# Patient Record
Sex: Female | Born: 2005 | Race: White | Hispanic: No | Marital: Single | Smoking: Never smoker
Health system: Southern US, Community
[De-identification: ages and names within clinical notes are randomized; demographics above are authoritative.]

## PROBLEM LIST (undated history)

## (undated) DIAGNOSIS — K5900 Constipation, unspecified: Secondary | ICD-10-CM

## (undated) DIAGNOSIS — R109 Unspecified abdominal pain: Secondary | ICD-10-CM

## (undated) HISTORY — DX: Constipation, unspecified: K59.00

## (undated) HISTORY — DX: Unspecified abdominal pain: R10.9

## (undated) HISTORY — PX: TONSILLECTOMY: SUR1361

---
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
## 2024-01-18 ENCOUNTER — Telehealth (INDEPENDENT_AMBULATORY_CARE_PROVIDER_SITE_OTHER): Admitting: Adult Health

## 2024-01-18 ENCOUNTER — Encounter: Payer: Self-pay | Admitting: Adult Health

## 2024-01-18 VITALS — Ht 64.0 in | Wt 130.0 lb

## 2024-01-18 DIAGNOSIS — F1910 Other psychoactive substance abuse, uncomplicated: Secondary | ICD-10-CM

## 2024-01-18 DIAGNOSIS — G4700 Insomnia, unspecified: Secondary | ICD-10-CM | POA: Diagnosis not present

## 2024-01-18 DIAGNOSIS — F411 Generalized anxiety disorder: Secondary | ICD-10-CM

## 2024-01-18 DIAGNOSIS — F0630 Mood disorder due to known physiological condition, unspecified: Secondary | ICD-10-CM

## 2024-01-18 DIAGNOSIS — F4310 Post-traumatic stress disorder, unspecified: Secondary | ICD-10-CM

## 2024-01-18 MED ORDER — LITHIUM CARBONATE 300 MG PO CAPS
300.0000 mg | ORAL_CAPSULE | Freq: Every day | ORAL | 0 refills | Status: DC
Start: 2024-01-18 — End: 2024-01-31

## 2024-01-18 MED ORDER — CLONIDINE HCL 0.1 MG PO TABS
0.1000 mg | ORAL_TABLET | Freq: Every day | ORAL | 0 refills | Status: DC
Start: 2024-01-18 — End: 2024-03-27

## 2024-01-18 NOTE — Progress Notes (Signed)
 Error

## 2024-01-18 NOTE — Addendum Note (Signed)
 Addended by: LAWERANCE ANGELINE SAILOR on: 01/18/2024 02:23 PM   Modules accepted: Level of Service
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
## 2024-01-18 NOTE — Addendum Note (Signed)
 Addended by: LAWERANCE ANGELINE SAILOR on: 01/18/2024 01:26 PM   Modules accepted: Orders
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
## 2024-01-23 ENCOUNTER — Telehealth: Payer: Self-pay | Admitting: Adult Health

## 2024-01-23 DIAGNOSIS — F418 Other specified anxiety disorders: Secondary | ICD-10-CM

## 2024-01-23 MED ORDER — HYDROXYZINE HCL 25 MG PO TABS: ORAL_TABLET | ORAL | 0 refills | Status: DC

## 2024-01-23 NOTE — Telephone Encounter (Signed)
 Patient advised to hold lithium  and touch base on Thursday. Rx for hydroxyzine  sent.

## 2024-01-23 NOTE — Telephone Encounter (Signed)
 Pt seen for initial visit on 6/26.    Add Lithium  300mg  at hs for - mood disorder, depression Add Clonidine  0.1mg  at hs - PTSD, sleep Add Hydroxyzine  50mg  daily as needed  Under medication changes it shows hydroxyzine  was discontinued and no Rx was sent.  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 Works [employer] and just can't function. Reports she thinks it is the lithium  causing sx.  She is taking at night. No other complaints.

## 2024-01-23 NOTE — Telephone Encounter (Signed)
 Pt called and said that the new medication is making her unable to function. She wants to change the medicine. Please call her at [phone]

## 2024-01-25 ENCOUNTER — Telehealth: Payer: Self-pay | Admitting: Adult Health

## 2024-01-25 NOTE — Telephone Encounter (Signed)
 Patient called in with update on medication. States that she stopped taking the Lithium  and doesn't feel sedated. Thinks that it what was causing her to feel that way. Ph: [phone] Appt 7/9

## 2024-01-25 NOTE — Telephone Encounter (Signed)
 Pt had called earlier this week, was c/o being sedated. You recommended stopping the lithium  and report back in today. She reports no sedation since stopping the lithium .

## 2024-01-29 NOTE — Telephone Encounter (Signed)
 Pt reports taking the clonidine . She has an appt on 7/8 and can discuss options at that time.

## 2024-01-31 ENCOUNTER — Telehealth (INDEPENDENT_AMBULATORY_CARE_PROVIDER_SITE_OTHER): Admitting: Adult Health

## 2024-01-31 ENCOUNTER — Encounter: Payer: Self-pay | Admitting: Adult Health

## 2024-01-31 DIAGNOSIS — G4700 Insomnia, unspecified: Secondary | ICD-10-CM | POA: Diagnosis not present

## 2024-01-31 DIAGNOSIS — F4310 Post-traumatic stress disorder, unspecified: Secondary | ICD-10-CM

## 2024-01-31 DIAGNOSIS — F1910 Other psychoactive substance abuse, uncomplicated: Secondary | ICD-10-CM

## 2024-01-31 DIAGNOSIS — F411 Generalized anxiety disorder: Secondary | ICD-10-CM | POA: Diagnosis not present

## 2024-01-31 DIAGNOSIS — F0630 Mood disorder due to known physiological condition, unspecified: Secondary | ICD-10-CM | POA: Diagnosis not present

## 2024-01-31 MED ORDER — TRAZODONE HCL 50 MG PO TABS
50.0000 mg | ORAL_TABLET | Freq: Every day | ORAL | 2 refills | Status: DC
Start: 2024-01-31 — End: 2024-03-27

## 2024-01-31 MED ORDER — LAMOTRIGINE 25 MG PO TABS: ORAL_TABLET | ORAL | 2 refills | Status: DC
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
## 2024-02-12 ENCOUNTER — Other Ambulatory Visit: Payer: Self-pay | Admitting: Adult Health

## 2024-02-12 DIAGNOSIS — F1910 Other psychoactive substance abuse, uncomplicated: Secondary | ICD-10-CM

## 2024-02-12 DIAGNOSIS — F4310 Post-traumatic stress disorder, unspecified: Secondary | ICD-10-CM

## 2024-02-12 DIAGNOSIS — F411 Generalized anxiety disorder: Secondary | ICD-10-CM

## 2024-02-12 DIAGNOSIS — G4700 Insomnia, unspecified: Secondary | ICD-10-CM

## 2024-02-20 ENCOUNTER — Other Ambulatory Visit: Payer: Self-pay | Admitting: Adult Health

## 2024-02-20 DIAGNOSIS — F418 Other specified anxiety disorders: Secondary | ICD-10-CM

## 2024-02-21 ENCOUNTER — Other Ambulatory Visit: Payer: Self-pay | Admitting: Adult Health

## 2024-02-21 DIAGNOSIS — F418 Other specified anxiety disorders: Secondary | ICD-10-CM

## 2024-02-22 ENCOUNTER — Other Ambulatory Visit: Payer: Self-pay | Admitting: Adult Health

## 2024-02-22 DIAGNOSIS — G4700 Insomnia, unspecified: Secondary | ICD-10-CM

## 2024-02-22 DIAGNOSIS — F0630 Mood disorder due to known physiological condition, unspecified: Secondary | ICD-10-CM

## 2024-02-22 MED ORDER — HYDROXYZINE HCL 50 MG PO TABS: ORAL_TABLET | ORAL | 0 refills | Status: DC

## 2024-03-13 ENCOUNTER — Ambulatory Visit: Admitting: Professional Counselor

## 2024-03-14 ENCOUNTER — Telehealth: Payer: Self-pay

## 2024-03-14 ENCOUNTER — Encounter: Payer: Self-pay | Admitting: Professional Counselor

## 2024-03-14 ENCOUNTER — Ambulatory Visit: Admitting: Professional Counselor

## 2024-03-14 DIAGNOSIS — Z634 Disappearance and death of family member: Secondary | ICD-10-CM

## 2024-03-14 DIAGNOSIS — F39 Unspecified mood [affective] disorder: Secondary | ICD-10-CM

## 2024-03-14 DIAGNOSIS — F411 Generalized anxiety disorder: Secondary | ICD-10-CM

## 2024-03-14 NOTE — Telephone Encounter (Signed)
 No earlier appts unless Tillman wants to give up her Admin time today and I can put her at 530.

## 2024-03-14 NOTE — Telephone Encounter (Signed)
 Thank you, but no to today.

## 2024-03-14 NOTE — Telephone Encounter (Signed)
 Pt in the office to see Deane today. Her father recently died and she reports not sleeping, though reports sleep issues pre-date his death.   D/C Clonidine  0.1mg  at hs - PTSD, sleep D/C Lithium  300mg  at hs for - mood disorder, depression - did not tolerate   Add Lamictal  25mg  at hs x 2 weeks, then increase to 50mg  at hs  Add Trazadone 50mg  at hs for sleep   Continue Hydroxyzine  50mg  daily as needed.  Reports taking medications as prescribed.    Note said she had [facility] elsewhere, but I don't see in her chart.
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
## 2024-03-14 NOTE — Telephone Encounter (Signed)
 Has FU 9/3.

## 2024-03-14 NOTE — Telephone Encounter (Signed)
 Please see if there is an earlier appt available and call patient to schedule.

## 2024-03-20 ENCOUNTER — Other Ambulatory Visit: Payer: Self-pay | Admitting: Adult Health

## 2024-03-20 DIAGNOSIS — G4700 Insomnia, unspecified: Secondary | ICD-10-CM

## 2024-03-27 ENCOUNTER — Encounter: Payer: Self-pay | Admitting: Adult Health

## 2024-03-27 ENCOUNTER — Telehealth: Admitting: Adult Health

## 2024-03-27 DIAGNOSIS — F0630 Mood disorder due to known physiological condition, unspecified: Secondary | ICD-10-CM

## 2024-03-27 DIAGNOSIS — F411 Generalized anxiety disorder: Secondary | ICD-10-CM

## 2024-03-27 DIAGNOSIS — F1910 Other psychoactive substance abuse, uncomplicated: Secondary | ICD-10-CM

## 2024-03-27 DIAGNOSIS — F4310 Post-traumatic stress disorder, unspecified: Secondary | ICD-10-CM | POA: Diagnosis not present

## 2024-03-27 DIAGNOSIS — G4700 Insomnia, unspecified: Secondary | ICD-10-CM | POA: Diagnosis not present

## 2024-03-27 MED ORDER — TRAZODONE HCL 100 MG PO TABS: 100.0000 mg | ORAL_TABLET | Freq: Every day | ORAL | 0 refills | Status: DC

## 2024-03-27 MED ORDER — PROPRANOLOL HCL 10 MG PO TABS
10.0000 mg | ORAL_TABLET | Freq: Three times a day (TID) | ORAL | 0 refills | Status: DC
Start: 2024-03-27 — End: 2024-04-21
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
## 2024-04-01 ENCOUNTER — Telehealth: Payer: Self-pay | Admitting: Adult Health

## 2024-04-01 DIAGNOSIS — G4700 Insomnia, unspecified: Secondary | ICD-10-CM

## 2024-04-01 MED ORDER — TRAZODONE HCL 100 MG PO TABS: 100.0000 mg | ORAL_TABLET | Freq: Every day | ORAL | 0 refills | Status: DC

## 2024-04-01 NOTE — Telephone Encounter (Signed)
 Patient called in stating that she moved over the weekend and her Trazedone 100mg  was lost in the process. She asked if refill could be sent to pharmacy. Ph: [phone] [facility] [address] [place], [place]

## 2024-04-01 NOTE — Telephone Encounter (Signed)
 Rx for trazodone  sent to the requested pharmacy with note that medication had been lost.

## 2024-04-10 ENCOUNTER — Encounter: Payer: Self-pay | Admitting: Professional Counselor

## 2024-04-10 ENCOUNTER — Ambulatory Visit: Admitting: Professional Counselor

## 2024-04-10 DIAGNOSIS — Z634 Disappearance and death of family member: Secondary | ICD-10-CM

## 2024-04-10 DIAGNOSIS — F411 Generalized anxiety disorder: Secondary | ICD-10-CM | POA: Diagnosis not present

## 2024-04-10 DIAGNOSIS — F4310 Post-traumatic stress disorder, unspecified: Secondary | ICD-10-CM | POA: Diagnosis not present

## 2024-04-10 DIAGNOSIS — F39 Unspecified mood [affective] disorder: Secondary | ICD-10-CM | POA: Diagnosis not present

## 2024-04-10 NOTE — Progress Notes (Unsigned)
      [facility] Counselor/Therapist Progress Note  Patient ID: Amber Aguirre, MRN: [id number],    Date: 04/10/2024  Time Spent: 3:07 PM to 4:10 PM  Treatment Type: Individual Therapy  Reported Symptoms: Trouble concentrating, grief/loss, low sleep, low appetite, emotional dysregulation, frustration,  Mental Status Exam:  Appearance:   Casual     Behavior:  Appropriate and Sharing  Motor:  Normal  Speech/Language:   Clear and Coherent and Normal Rate  Affect:  Appropriate and Congruent  Mood:  Frustrated, sad  Thought process:  normal  Thought content:    WNL  Sensory/Perceptual disturbances:    WNL  Orientation:  oriented to person, place, time/date, and situation  Attention:  Good  Concentration:  Good  Memory:  WNL  Fund of knowledge:   Good  Insight:    Good  Judgment:   Good  Impulse Control:  Good   Risk Assessment: Danger to Self:  Yes.  without intent/plan; excess self medicating Self-injurious Behavior: excess self-medicating Danger to Others: No Duty to Warn:no Physical Aggression / Violence:No  Access to Firearms a concern: No  Gang Involvement:No   Subjective: ***   Interventions: Solution-Oriented/Positive Psychology, Humanistic/Existential, Insight-Oriented, and Aeronautical engineer, Treatment Planning, Resourcing  Diagnosis:   ICD-10-CM   1. Unspecified mood (affective) disorder (HCC)  F39     2. Generalized anxiety disorder  F41.1     3. PTSD (post-traumatic stress disorder)  F43.10     4. Bereavement  Z63.4       Plan: ***  Amber Aguirre, [facility]

## 2024-04-17 ENCOUNTER — Encounter: Payer: Self-pay | Admitting: Professional Counselor

## 2024-04-17 ENCOUNTER — Ambulatory Visit (INDEPENDENT_AMBULATORY_CARE_PROVIDER_SITE_OTHER): Admitting: Professional Counselor

## 2024-04-17 DIAGNOSIS — F411 Generalized anxiety disorder: Secondary | ICD-10-CM

## 2024-04-17 DIAGNOSIS — F39 Unspecified mood [affective] disorder: Secondary | ICD-10-CM

## 2024-04-17 DIAGNOSIS — F4310 Post-traumatic stress disorder, unspecified: Secondary | ICD-10-CM | POA: Diagnosis not present

## 2024-04-17 NOTE — Progress Notes (Signed)
      [facility] Counselor/Therapist Progress Note  Patient ID: Amber Aguirre, MRN: [id number],    Date: 04/17/2024  Time Spent: 11:24 AM to 12:10 PM  Treatment Type: Individual Therapy  Reported Symptoms: Emotional dysregulation, diminished self-care, low appetite, stress, grief/loss, sense of apathy, anhedonia, low mood, low motivation, family conflict, interpersonal concerns, substance use concerns, phase of life concerns, worries  Mental Status Exam:  Appearance:   Casual     Behavior:  Appropriate and Sharing  Motor:  Normal  Speech/Language:   Clear and Coherent and Normal Rate  Affect:  Appropriate and Congruent  Mood:  normal  Thought process:  normal  Thought content:    WNL  Sensory/Perceptual disturbances:    WNL  Orientation:  oriented to person, place, time/date, and situation  Attention:  Good  Concentration:  Good  Memory:  WNL  Fund of knowledge:   Good  Insight:    Good  Judgment:   Good  Impulse Control:  Good   Risk Assessment: Danger to Self:  No Self-injurious Behavior: No Danger to Others: No Duty to Warn:no Physical Aggression / Violence:No  Access to Firearms a concern: No  Gang Involvement:No   Subjective: Patient presented to session to address concerns of depression as relates mood disorder, anxiety, trauma response pattern.  Patient reported minimal progress at this time.  She reported self-medicating with marijuana.  She reported exacerbation of family conflict and stress, and processed the experience of particular instances of negative impact to her wellbeing.  Patient processed experience of worry about her mother and her wellbeing, and stress at work and efforts to self advocate.  Counselor actively listened, affirmed patient feelings and experience, and helped facilitate insight into patient concerns, and reinforced patient strategies toward self advocacy and interpersonal relationships and work role.  Counselor helped to instill hope and  reinforced patient coping skills and strengths, including prioritization of her holistic self-care.  Interventions: Solution-Oriented/Positive Psychology, Humanistic/Existential, and Insight-Oriented  Diagnosis:   ICD-10-CM   1. Unspecified mood (affective) disorder  F39     2. Generalized anxiety disorder  F41.1     3. PTSD (post-traumatic stress disorder)  F43.10       Plan: Patient is scheduled for follow-up; continue process work and developing coping skills.  STG between sessions for patient to address nighttime routine toward improved sleep and work preparation efficacy, such as taking nighttime showers and laying her clothes out, limiting screen time, and other strategies.  Almarie ONEIDA Sprang, [facility]

## 2024-04-19 ENCOUNTER — Other Ambulatory Visit: Payer: Self-pay | Admitting: Adult Health

## 2024-04-19 DIAGNOSIS — F411 Generalized anxiety disorder: Secondary | ICD-10-CM

## 2024-05-10 ENCOUNTER — Other Ambulatory Visit: Payer: Self-pay | Admitting: Adult Health

## 2024-05-10 DIAGNOSIS — F0630 Mood disorder due to known physiological condition, unspecified: Secondary | ICD-10-CM

## 2024-05-14 ENCOUNTER — Other Ambulatory Visit: Payer: Self-pay | Admitting: Adult Health

## 2024-05-16 ENCOUNTER — Telehealth: Admitting: Adult Health

## 2024-05-16 ENCOUNTER — Encounter: Payer: Self-pay | Admitting: Adult Health

## 2024-05-16 DIAGNOSIS — F411 Generalized anxiety disorder: Secondary | ICD-10-CM | POA: Diagnosis not present

## 2024-05-16 DIAGNOSIS — F0630 Mood disorder due to known physiological condition, unspecified: Secondary | ICD-10-CM

## 2024-05-16 DIAGNOSIS — G4700 Insomnia, unspecified: Secondary | ICD-10-CM

## 2024-05-16 DIAGNOSIS — F4310 Post-traumatic stress disorder, unspecified: Secondary | ICD-10-CM | POA: Diagnosis not present

## 2024-05-16 DIAGNOSIS — F39 Unspecified mood [affective] disorder: Secondary | ICD-10-CM

## 2024-05-16 DIAGNOSIS — Z634 Disappearance and death of family member: Secondary | ICD-10-CM

## 2024-05-16 MED ORDER — CLONAZEPAM 0.5 MG PO TABS: 0.5000 mg | ORAL_TABLET | Freq: Every day | ORAL | 0 refills | Status: DC

## 2024-05-16 MED ORDER — OLANZAPINE 2.5 MG PO TABS: 2.5000 mg | ORAL_TABLET | Freq: Every day | ORAL | 0 refills | Status: DC

## 2024-05-16 MED ORDER — TRAZODONE HCL 100 MG PO TABS: 100.0000 mg | ORAL_TABLET | Freq: Every day | ORAL | 0 refills | Status: DC
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
## 2024-05-17 ENCOUNTER — Ambulatory Visit (INDEPENDENT_AMBULATORY_CARE_PROVIDER_SITE_OTHER): Payer: Self-pay | Admitting: Professional Counselor

## 2024-05-17 DIAGNOSIS — Z91199 Patient's noncompliance with other medical treatment and regimen due to unspecified reason: Secondary | ICD-10-CM

## 2024-05-17 NOTE — Progress Notes (Signed)
 Patient did not show for scheduled appointment. No message.  Almarie Sprang, [facility]

## 2024-06-07 ENCOUNTER — Other Ambulatory Visit: Payer: Self-pay | Admitting: Adult Health

## 2024-06-07 DIAGNOSIS — F0630 Mood disorder due to known physiological condition, unspecified: Secondary | ICD-10-CM

## 2024-06-09 NOTE — Telephone Encounter (Signed)
 Sent My-Chart message

## 2024-06-11 NOTE — Telephone Encounter (Signed)
 Lvm for pt to call and schedule

## 2024-06-11 NOTE — Telephone Encounter (Signed)
 Please call to schedule FU

## 2024-06-26 ENCOUNTER — Encounter: Payer: Self-pay | Admitting: Professional Counselor

## 2024-06-26 ENCOUNTER — Ambulatory Visit (INDEPENDENT_AMBULATORY_CARE_PROVIDER_SITE_OTHER): Payer: Self-pay | Admitting: Professional Counselor

## 2024-06-26 DIAGNOSIS — Z0389 Encounter for observation for other suspected diseases and conditions ruled out: Secondary | ICD-10-CM

## 2024-06-26 NOTE — Progress Notes (Signed)
 Patient did not show for scheduled appointment. No message.  Almarie Sprang, [facility]

## 2024-06-28 ENCOUNTER — Other Ambulatory Visit: Payer: Self-pay | Admitting: Adult Health

## 2024-06-28 DIAGNOSIS — G4700 Insomnia, unspecified: Secondary | ICD-10-CM

## 2024-06-30 NOTE — Telephone Encounter (Signed)
 Please call to schedule FU, Did not respond to MyChart message.

## 2024-07-01 NOTE — Telephone Encounter (Signed)
 Lvm for pt to call and schedule

## 2024-07-09 ENCOUNTER — Other Ambulatory Visit: Payer: Self-pay | Admitting: Adult Health

## 2024-07-09 DIAGNOSIS — F0630 Mood disorder due to known physiological condition, unspecified: Secondary | ICD-10-CM

## 2024-07-10 ENCOUNTER — Ambulatory Visit: Admitting: Professional Counselor

## 2024-07-23 ENCOUNTER — Encounter: Payer: Self-pay | Admitting: Adult Health

## 2024-07-23 ENCOUNTER — Telehealth: Admitting: Adult Health

## 2024-07-23 DIAGNOSIS — F411 Generalized anxiety disorder: Secondary | ICD-10-CM | POA: Diagnosis not present

## 2024-07-23 DIAGNOSIS — F0630 Mood disorder due to known physiological condition, unspecified: Secondary | ICD-10-CM | POA: Diagnosis not present

## 2024-07-23 DIAGNOSIS — F4310 Post-traumatic stress disorder, unspecified: Secondary | ICD-10-CM

## 2024-07-23 DIAGNOSIS — G4700 Insomnia, unspecified: Secondary | ICD-10-CM

## 2024-07-23 MED ORDER — PROPRANOLOL HCL 10 MG PO TABS: 10.0000 mg | ORAL_TABLET | Freq: Two times a day (BID) | ORAL | 1 refills | Status: DC

## 2024-07-23 MED ORDER — OLANZAPINE 5 MG PO TABS: 5.0000 mg | ORAL_TABLET | Freq: Every day | ORAL | 1 refills | Status: DC

## 2024-07-23 MED ORDER — TRAZODONE HCL 100 MG PO TABS: 100.0000 mg | ORAL_TABLET | Freq: Every day | ORAL | 1 refills | Status: DC

## 2024-07-23 MED ORDER — CLONAZEPAM 0.5 MG PO TABS: 0.5000 mg | ORAL_TABLET | Freq: Every day | ORAL | 3 refills | Status: DC

## 2024-07-23 MED ORDER — LAMOTRIGINE 25 MG PO TABS: ORAL_TABLET | ORAL | 1 refills | Status: DC
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
## 2024-08-15 ENCOUNTER — Other Ambulatory Visit: Payer: Self-pay | Admitting: Adult Health

## 2024-08-15 DIAGNOSIS — F0630 Mood disorder due to known physiological condition, unspecified: Secondary | ICD-10-CM

## 2024-08-15 DIAGNOSIS — F411 Generalized anxiety disorder: Secondary | ICD-10-CM

## 2024-08-15 NOTE — Telephone Encounter (Signed)
 Good Morning!  Please call pt to schedule her next appt., she is due to return 4 weeks from 07/23/24  Thank you

## 2024-08-23 ENCOUNTER — Other Ambulatory Visit: Payer: Self-pay | Admitting: Adult Health

## 2024-08-23 DIAGNOSIS — G4700 Insomnia, unspecified: Secondary | ICD-10-CM

## 2024-08-23 NOTE — Telephone Encounter (Signed)
 Has script available at [facility] [address]. LVM to [facility] - ? Pharmacy.

## 2024-08-28 NOTE — Telephone Encounter (Signed)
 Sent MyChart note asking which pharmacy, [facility] [address] [facility] [place].

## 2024-08-29 ENCOUNTER — Telehealth: Admitting: Adult Health

## 2024-08-29 ENCOUNTER — Encounter: Payer: Self-pay | Admitting: Adult Health

## 2024-08-29 DIAGNOSIS — F411 Generalized anxiety disorder: Secondary | ICD-10-CM

## 2024-08-29 DIAGNOSIS — G4700 Insomnia, unspecified: Secondary | ICD-10-CM

## 2024-08-29 DIAGNOSIS — F4310 Post-traumatic stress disorder, unspecified: Secondary | ICD-10-CM

## 2024-08-29 DIAGNOSIS — F0630 Mood disorder due to known physiological condition, unspecified: Secondary | ICD-10-CM

## 2024-08-29 MED ORDER — CLONAZEPAM 0.5 MG PO TABS: 0.5000 mg | ORAL_TABLET | Freq: Every day | ORAL | 1 refills | Status: AC

## 2024-08-29 MED ORDER — TRAZODONE HCL 100 MG PO TABS: 100.0000 mg | ORAL_TABLET | Freq: Every day | ORAL | 1 refills | Status: AC

## 2024-08-29 MED ORDER — PROPRANOLOL HCL 10 MG PO TABS: 10.0000 mg | ORAL_TABLET | Freq: Two times a day (BID) | ORAL | 1 refills | Status: AC

## 2024-08-29 MED ORDER — LAMOTRIGINE 25 MG PO TABS: ORAL_TABLET | ORAL | 1 refills | Status: AC

## 2024-08-29 MED ORDER — OLANZAPINE 5 MG PO TABS: 5.0000 mg | ORAL_TABLET | Freq: Every day | ORAL | 1 refills | Status: AC
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
## 2024-08-29 NOTE — Telephone Encounter (Signed)
Has appt with Gina today.
